# Patient Record
Sex: Male | Born: 1990 | Race: White | Hispanic: No | Marital: Single | Smoking: Current every day smoker
Health system: Southern US, Community
[De-identification: ages and names within clinical notes are randomized; demographics above are authoritative.]

## PROBLEM LIST (undated history)

## (undated) DIAGNOSIS — G43909 Migraine, unspecified, not intractable, without status migrainosus: Secondary | ICD-10-CM

## (undated) DIAGNOSIS — Z860100 Personal history of colon polyps, unspecified: Secondary | ICD-10-CM

## (undated) DIAGNOSIS — K5190 Ulcerative colitis, unspecified, without complications: Secondary | ICD-10-CM

## (undated) HISTORY — DX: Personal history of colonic polyps: Z86.010

## (undated) HISTORY — DX: Ulcerative colitis, unspecified, without complications: K51.90

## (undated) HISTORY — PX: HAND SURGERY: SHX662

## (undated) HISTORY — DX: Migraine, unspecified, not intractable, without status migrainosus: G43.909

## (undated) HISTORY — DX: Personal history of colon polyps, unspecified: Z86.0100

---
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
## 2010-03-03 HISTORY — PX: FRACTURE SURGERY: SHX138

## 2010-05-18 LAB — POCT HEMOGLOBIN-HEMACUE: Hemoglobin: 16.9 g/dL (ref 13.0–17.0)

## 2010-05-19 LAB — URINALYSIS, ROUTINE W REFLEX MICROSCOPIC
Bilirubin Urine: NEGATIVE
Glucose, UA: NEGATIVE mg/dL
Hgb urine dipstick: NEGATIVE
Ketones, ur: NEGATIVE mg/dL
Nitrite: NEGATIVE
Protein, ur: NEGATIVE mg/dL
Specific Gravity, Urine: 1.025 (ref 1.005–1.030)
Urobilinogen, UA: 0.2 mg/dL (ref 0.0–1.0)
pH: 6.5 (ref 5.0–8.0)

## 2010-05-19 LAB — HEMOCCULT GUIAC POC 1CARD (OFFICE): Fecal Occult Bld: POSITIVE

## 2010-05-19 LAB — COMPREHENSIVE METABOLIC PANEL WITH GFR
AST: 22 U/L (ref 0–37)
Alkaline Phosphatase: 104 U/L (ref 39–117)
GFR calc Af Amer: >60 mL/min (ref >60)
Glucose, Bld: 88 mg/dL (ref 70–99)
Total Bilirubin: 0.4 mg/dL (ref 0.3–1.2)
Total Protein: 7.6 g/dL (ref 6.0–8.3)

## 2010-05-19 LAB — COMPREHENSIVE METABOLIC PANEL
ALT: 22 U/L (ref 0–53)
Albumin: 4 g/dL (ref 3.5–5.2)
BUN: 17 mg/dL (ref 6–23)
CO2: 23 mEq/L (ref 19–32)
Calcium: 9.5 mg/dL (ref 8.4–10.5)
Chloride: 108 mEq/L (ref 96–112)
Creatinine, Ser: 0.7 mg/dL (ref 0.4–1.5)
GFR calc non Af Amer: >60 mL/min (ref >60)
Potassium: 3.6 mEq/L (ref 3.5–5.1)
Sodium: 145 mEq/L (ref 135–145)

## 2010-05-19 LAB — LIPASE, BLOOD: Lipase: 31 U/L (ref 23–300)

## 2010-05-19 LAB — CBC
HCT: 49 % (ref 39.0–52.0)
Hemoglobin: 16.5 g/dL (ref 13.0–17.0)
MCHC: 33.7 g/dL (ref 30.0–36.0)
MCV: 85.4 fL (ref 78.0–100.0)
Platelets: 392 K/uL (ref 150–400)
RBC: 5.74 MIL/uL (ref 4.22–5.81)
RDW: 14 % (ref 11.5–15.5)
WBC: 12.2 K/uL — ABNORMAL HIGH (ref 4.0–10.5)

## 2010-05-19 LAB — DIFFERENTIAL
Basophils Absolute: 0.2 10*3/uL — ABNORMAL HIGH (ref 0.0–0.1)
Basophils Relative: 2 % — ABNORMAL HIGH (ref 0–1)
Eosinophils Absolute: 0.3 10*3/uL (ref 0.0–0.7)
Eosinophils Relative: 2 % (ref 0–5)
Lymphocytes Relative: 21 % (ref 12–46)
Lymphs Abs: 2.5 10*3/uL (ref 0.7–4.0)
Monocytes Absolute: 1.2 K/uL — ABNORMAL HIGH (ref 0.1–1.0)
Monocytes Relative: 10 % (ref 3–12)
Neutro Abs: 8 10*3/uL — ABNORMAL HIGH (ref 1.7–7.7)
Neutrophils Relative %: 66 % (ref 43–77)

## 2010-07-15 NOTE — Op Note (Signed)
[name], [name]               ACCOUNT NO.:  [id number]   MEDICAL RECORD NO.:  [id number]          PATIENT TYPE:  AMB   LOCATION:  SDS                          FACILITY:  [facility]   PHYSICIAN:  Jon Gills, M.D.  DATE OF BIRTH:  06-Apr-1990   DATE OF PROCEDURE:  03/11/2007  DATE OF DISCHARGE:  03/11/2007                               OPERATIVE REPORT   PREOPERATIVE DIAGNOSIS:  Ulcerative colitis with lower gastrointestinal  bleeding.   POSTOPERATIVE DIAGNOSIS:  Ulcerative colitis with lower gastrointestinal  bleeding.   NAME OF OPERATION:  Colonoscopy with biopsy.   SURGEON:  Jon Gills, MD   ASSISTANT:  None.   DESCRIPTION OF FINDINGS:  Following informed written consent, the  patient was taken to the operating room and placed under general  anesthesia with continuous cardiopulmonary monitoring.  He remained in  the supine position and examination of perineum revealed no tags or  fissures.  Digital examination of the rectum revealed an empty rectal  vault.  The Pentax colonoscope was inserted per rectum and advanced  without difficulty to the terminal ileum.  Diffuse granularity was  present during the first 50 cm of colon with occasional serpiginous  ulceration.  The remainder of the colon was erythematous but less  granular.  The ascending colon was obscured by adherent stool, however.  I was able to cannulate the ileocecal valve and examination of terminal  ileum was grossly normal.  Multiple biopsies of the terminal ileum were  histologically normal, whereas biopsies throughout the colon revealed  active inflammation.  No granulomata were seen.  The endoscope was  gradually withdrawn and the patient was awakened and taken to the  recovery room in satisfactory condition.  He will be released later  today to the care of his family.   DESCRIPTION OF TECHNICAL PROCEDURES USED:  Pentax colonoscope with cold  biopsy forceps.   DESCRIPTION OF SPECIMENS REMOVED:   Terminal ileum x3 in formalin.  Ascending colon x3 in formalin.  Transverse colon x3 in formalin.  Descending colon x3 in formalin.  Sigmoid colon x3 in formalin.  Rectum  x3 in formalin.           ______________________________  Jon Gills, M.D.     JHC/MEDQ  D:  03/25/2007  T:  03/25/2007  Job:  [id number]   cc:   Tammy R. Collins Scotland, M.D.

## 2010-07-15 NOTE — Discharge Summary (Signed)
Johnathan Suarez, Johnathan Suarez               ACCOUNT NO.:  [id number]   MEDICAL RECORD NO.:  [id number]          PATIENT TYPE:  INP   LOCATION:  6120                         FACILITY:  [facility]   PHYSICIAN:  Dr. Benay Pike            DATE OF BIRTH:  1991/01/25   DATE OF ADMISSION:  01/18/2007  DATE OF DISCHARGE:  01/23/2007                               DISCHARGE SUMMARY   REASON FOR HOSPITALIZATION:  Ulcerative colitis exacerbation.   HOSPITAL COURSE:  A 20 year old male with a past medical history of  ulcerative colitis presents with severe pain and grossly bloody  diarrhea.  Initial H&H demonstrated a hemoglobin of 15.9 and hematocrit  of 47.1.  It did not change significantly during stay.  Stool studies,  amylase, lipase, and KUB were all within normal limits.  He started on  IV steroids and was placed on a Dilaudid PCA for pain.  Bloody diarrhea  had resolved, and PCA was slowly weaned.  The patient was tolerating  p.o. prednisone, azithromycin, Percocet, and famotidine well, so the PCA  was discontinued on January 21, 2007, and he was changed to oral pain  medications on January 22, 2007.  The patient continued to do well on  oral medications and tolerated a regular pediatric diet well.  He was  discharged home in good condition with resolution of his bloody stools  and control of his pain.   OPERATIONS AND PROCEDURES:  None.   FINAL DIAGNOSIS:  Ulcerative colitis exacerbation.   DISCHARGE MEDICATIONS AND INSTRUCTIONS:  The patient was advised to call  Dr. Chestine Spore, his pediatric GI subspecialist doctor, on Monday to schedule  a followup appointment.  He has previously scheduled an appointment on  February 03, 2007, and he should ask Dr. Chestine Spore if he needs to follow up  sooner.  He was also advised to contact Dr. Chestine Spore or his primary care  physician, Dr. Collins Scotland, or return to the emergency department should he  have return of abdominal pain and bloody stools as they are currently  resolved.   The patient was advised to start the continuous home  medications of azathioprine 100 mg once a day as well as his famotidine  20 mg b.i.d., and he was started on a prednisone taper of 30 mg b.i.d.  x7 days then to 25 mg b.i.d. x7 days then to 20 mg b.i.d. x7 days.  That  should take him through his appointment with Dr. Chestine Spore as scheduled on  February 03, 2007, and further wean should be advised by Dr. Chestine Spore.  Of  note, the patient was on 5 mg of prednisone p.o. once a day per the  patient upon arrival.  Oxycodone 5-10 mg p.o. q.4-6 p.r.n. for pain, 30  pills were dispensed.   PENDING RESULTS:  None.   FOLLOWUP:  Dr. Chestine Spore on February 03, 2007 or sooner if advised so by Dr.  Chestine Spore.   DISCHARGE CONDITION:  Stable.   Discharge summaries were not faxed to the primary care physician or to  his consultant, Dr. Chestine Spore, as neither fax number was able  to be obtained  by searching the web.  Dr. Collins Scotland clinic phone number is [phone],  and Dr. Ophelia Charter clinic is called [facility].  His clinic phone number is [phone].           ______________________________  Dr. Claudell Kyle  D:  01/23/2007  T:  01/23/2007  Job:  [id number]

## 2010-07-18 NOTE — Discharge Summary (Signed)
NAMEKEYLEN, ECKENRODE               ACCOUNT NO.:  [id number]   MEDICAL RECORD NO.:  [id number]          PATIENT TYPE:  INP   LOCATION:  6148                         FACILITY:  [facility]   PHYSICIAN:  Henrietta Hoover, MD    DATE OF BIRTH:  20-Sep-1990   DATE OF ADMISSION:  06/19/2005  DATE OF DISCHARGE:  06/24/2005                                 DISCHARGE SUMMARY   HOSPITAL COURSE:  A 20 year old male who was admitted for shoulder pain,  tachypnea, shortness of breath, and chest pain.  He had a chest x-ray which  showed left lower lobe pneumonia and left pleural effusion.  The patient has  a history of ulcerative colitis as well.  He was started on IV Zosyn and  azithromycin.  He was continued on his sulfasalazine at 1500 mg b.i.d. and  prednisone 20 mg daily.  On June 20, 2005, he was started on Solu-Medrol 12  mg b.i.d. instead of his prednisone.  He remained afebrile throughout the  entire hospital stay.  He required oxygen initially but was quickly weaned.  He had several episodes of bloody diarrhea secondary to his ulcerative  colitis, but he improved clinically on his aforementioned medication  regimen.   OPERATIONS/PROCEDURES:  Chest x-ray on June 20, 2005, left lower lobe  pneumonia and small moderate-sized left pleural effusion.   Chest x-ray on June 21, 2005, stable and unchanged.   DIAGNOSES:  1.  Left lower lobe pneumonia.  2.  Ulcerative colitis.   MEDICATIONS:  1.  Avelox 400 mg p.o. daily x6 days.  2.  Prednisone 30 mg p.o. daily.  3.  Sulfasalazine 1500 mg p.o. b.i.d.   DISCHARGE WEIGHT:  59.6 kg.   DISCHARGE CONDITION:  Improved.   DISCHARGE INSTRUCTIONS/FOLLOWUP:  1.  Patient is to follow up with Dr. Yehuda Budd, his primary care physician, on      June 26, 2005 at 2:30 p.m.  2.  He is to follow up with Dr. Chestine Spore of GI, for which he already has an      appointment in two weeks.  3.  He is to limit physical activity and advance slowly as tolerated.    ______________________________  Pediatrics Resident    ______________________________  Henrietta Hoover, MD   PR/MEDQ  D:  06/24/2005  T:  06/25/2005  Job:  [id number]

## 2010-07-18 NOTE — Op Note (Signed)
NAMEHORATIO, BERTZ               ACCOUNT NO.:  [id number]   MEDICAL RECORD NO.:  [id number]          PATIENT TYPE:  INP   LOCATION:  6122                         FACILITY:  [facility]   PHYSICIAN:  Jon Gills, M.D.  DATE OF BIRTH:  05-06-1990   DATE OF PROCEDURE:  09/23/2004  DATE OF DISCHARGE:  09/29/2004                                 OPERATIVE REPORT   PREOPERATIVE DIAGNOSIS:  Bloody diarrhea.   POSTOPERATIVE DIAGNOSIS:  Universal colitis, presumably ulcerative colitis.   OPERATION PERFORMED:  Colonoscopy with biopsy.   SURGEON:  Jon Gills, M.D.   ASSISTANT:  None.   DESCRIPTION OF PROCEDURE:  Following informed written consent, the patient  was taken to the operating room and placed under general anesthesia with  continuous cardiopulmonary monitoring.  He remained in the supine position  and examination of the perineum revealed no tags or fissures.  Digital  examination of the rectum revealed an empty rectal vault.  The Olympus PCF-  140 colonoscope was inserted per rectum and advanced without difficulty.  The entire colon was examined and found to have diffuse granularity,  erythema, edema and friability.  No skip lesions were seen.  No serpiginous  ulcers were seen.  The cecum was entered and the ileocecal valve lips were  swollen.  Unfortunately, I was unable to cannulate the terminal ileum.  Multiple biopsies were obtained while removing the colonoscope. Once the  colonoscope was withdrawn, Rasean was awakened and taken to the recovery  room in satisfactory condition.  He will be transferred to the pediatric  inpatient unit once he is awake for initiation of intravenous steroid  therapy for presumed inflammatory bowel disease.   DESCRIPTION OF TECHNICAL PROCEDURES USED:  Olympus PCF-140 colonoscope with  cold biopsy forceps.   DESCRIPTION OF SPECIMENS REMOVED:  Colon biopsies times 12 submitted in  Formalin to surgical pathology.       JHC/MEDQ  D:   10/01/2004  T:  10/02/2004  Job:  [id number]

## 2010-07-18 NOTE — Discharge Summary (Signed)
Johnathan Suarez, Johnathan Suarez               ACCOUNT NO.:  [id number]   MEDICAL RECORD NO.:  [id number]          PATIENT TYPE:  INP   LOCATION:  6122                         FACILITY:  [facility]   PHYSICIAN:  Orie Rout, M.D.DATE OF BIRTH:  01/06/1991   DATE OF ADMISSION:  09/22/2004  DATE OF DISCHARGE:  09/29/2004                                 DISCHARGE SUMMARY   HOSPITAL COURSE:  The patient is a 20 year old male who presented with  several months of weight loss and bloody diarrhea which was worsening on the  days prior to admission.  On admission, the patient was markedly anemic with  a hemoglobin 7.3.  The patient was transfused 2 units of packed red blood  cells and his hemoglobin remained stable throughout the rest of the hospital  course.  On hospital day two, the patient had a colonoscopy which revealed  severe pancolitis.  Biopsies later confirmed ulcerative colitis.  The  patient was started on IV steroids on July 25 and the patient's diet was  gradually advanced.  The patient's bowel movements gradually became more  solid, as well.  The patient was started on iron sulfate and Sulfasalazine  p.o. as well as oral prednisone prior to discharge.  At the time of  discharge, the patient was afebrile and was tolerating p.o.   OPERATIONS AND PROCEDURES:  1.  September 22, 2004, the patient received 2 units of packed red blood cells.  2.  September 23, 2004, colonoscopy revealed severe pancolitis as well as      biopsies which showed inflammatory colitis, likely ulcerative colitis.      No dysplasia or malignancy.   LABORATORY DATA:  Admission CBC showed white blood cell count 8.5,  hemoglobin 7.3, hematocrit 24.4, platelets 698.  CBC on July 29 showed white  blood cell count 12, hemoglobin 9.7, hematocrit 31.1, platelets 812.  Stool  cultures negative.  O&P negative.  EHEC toxin negative.  C. diff negative.   DIAGNOSIS:  Ulcerative colitis.   MEDICATIONS:  Sulfasalazine 500 mg p.o. b.i.d.,  ferrous sulfate 325 mg p.o.  b.i.d., prednisone 30 mg p.o. b.i.d., Pepcid 20 mg p.o. b.i.d.   DISCHARGE WEIGHT:  66.5 kilograms.   CONDITION ON DISCHARGE:  Good/improved.   DISCHARGE INSTRUCTIONS:  1.  Dr. Chestine Spore, pediatric GI on Wednesday, October 01, 2004, at 10:20 a.m.  2.  Dr. Caryl Never at [facility] on October 07, 2004, at 2      p.m.  The patient was advised to return to the hospital or seek medical      attention if he is having any symptoms of light headedness, dizziness,      or any other concerns.       MR/MEDQ  D:  09/29/2004  T:  09/29/2004  Job:  [id number]

## 2010-08-06 IMAGING — CR DG HAND COMPLETE 3+V*R*
3 series · 3 of 3 positions shown · non-contrast
Comparison: None

CLINICAL DATA: Right hand injury and deformity; pain and swelling
at the third and fourth metacarpal.

RIGHT HAND - COMPLETE 3+ VIEW

[x hand pa right]
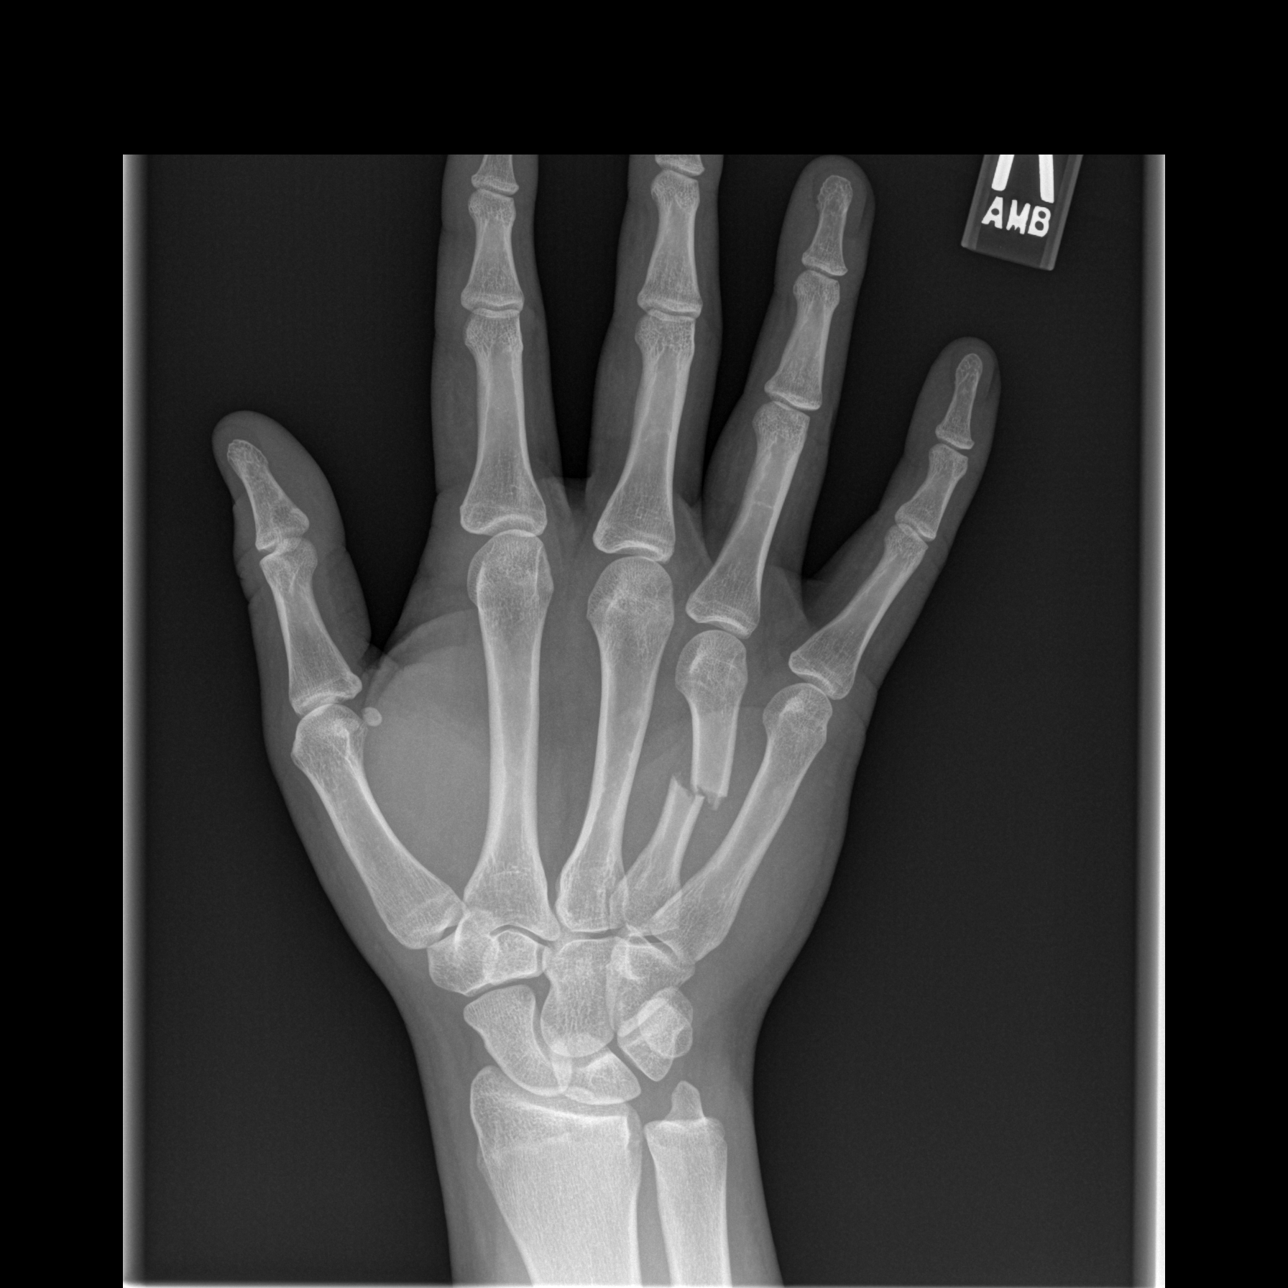

[x hand oblique right]
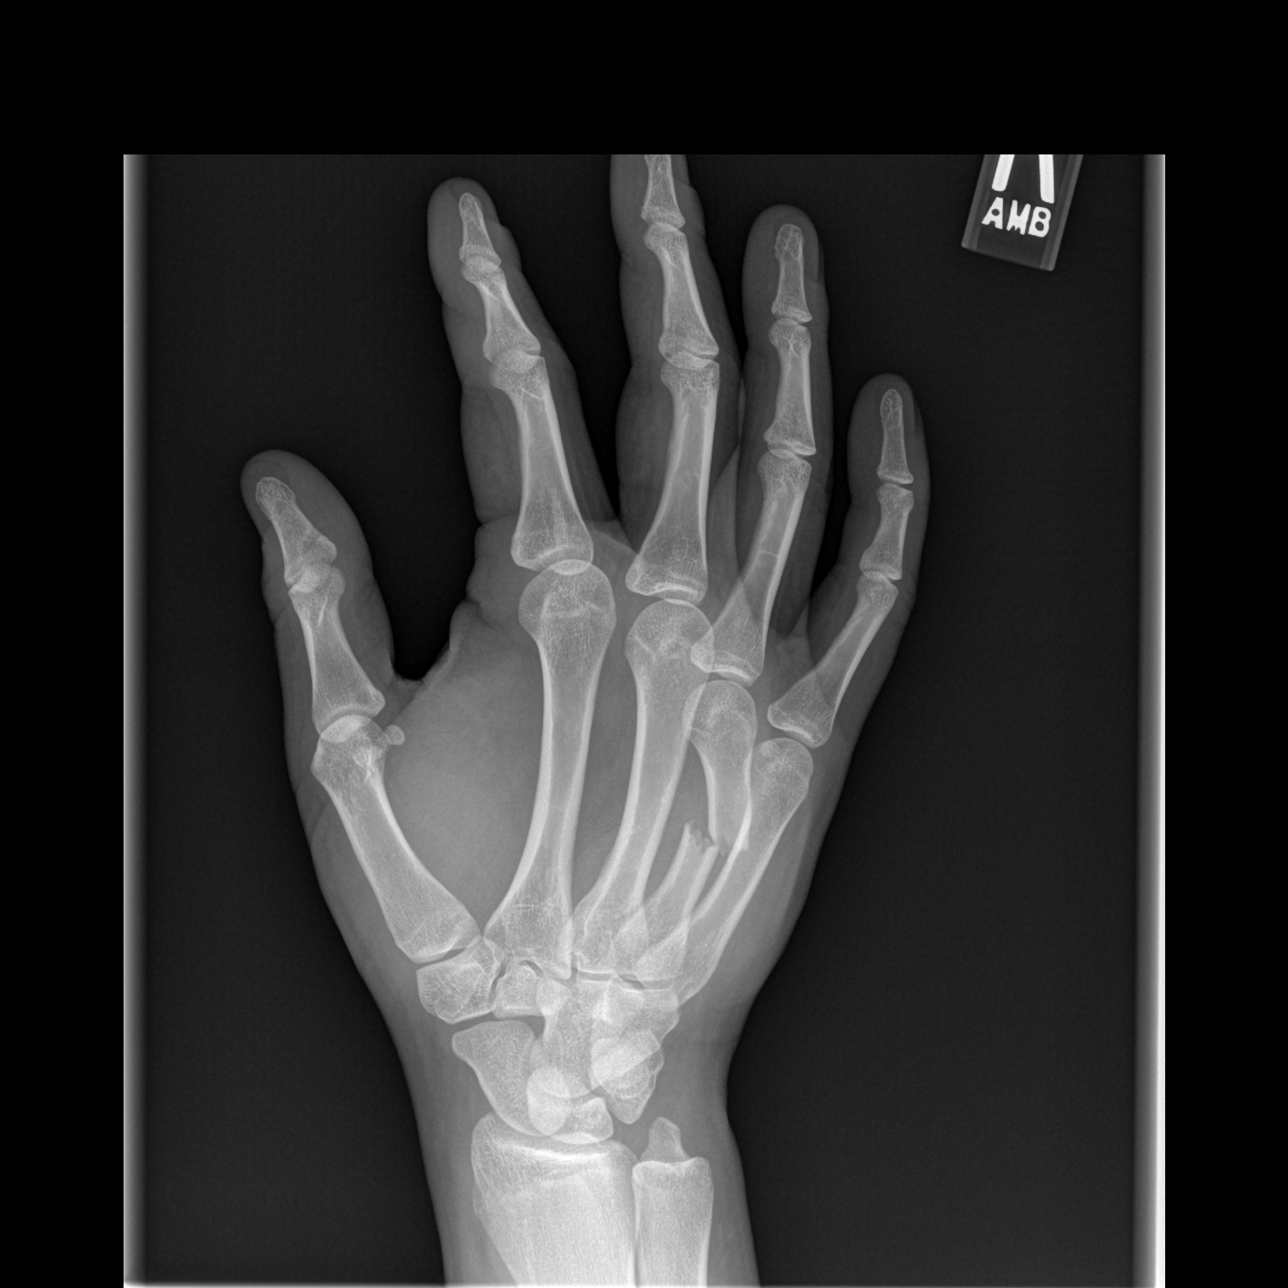

[x hand lat right]
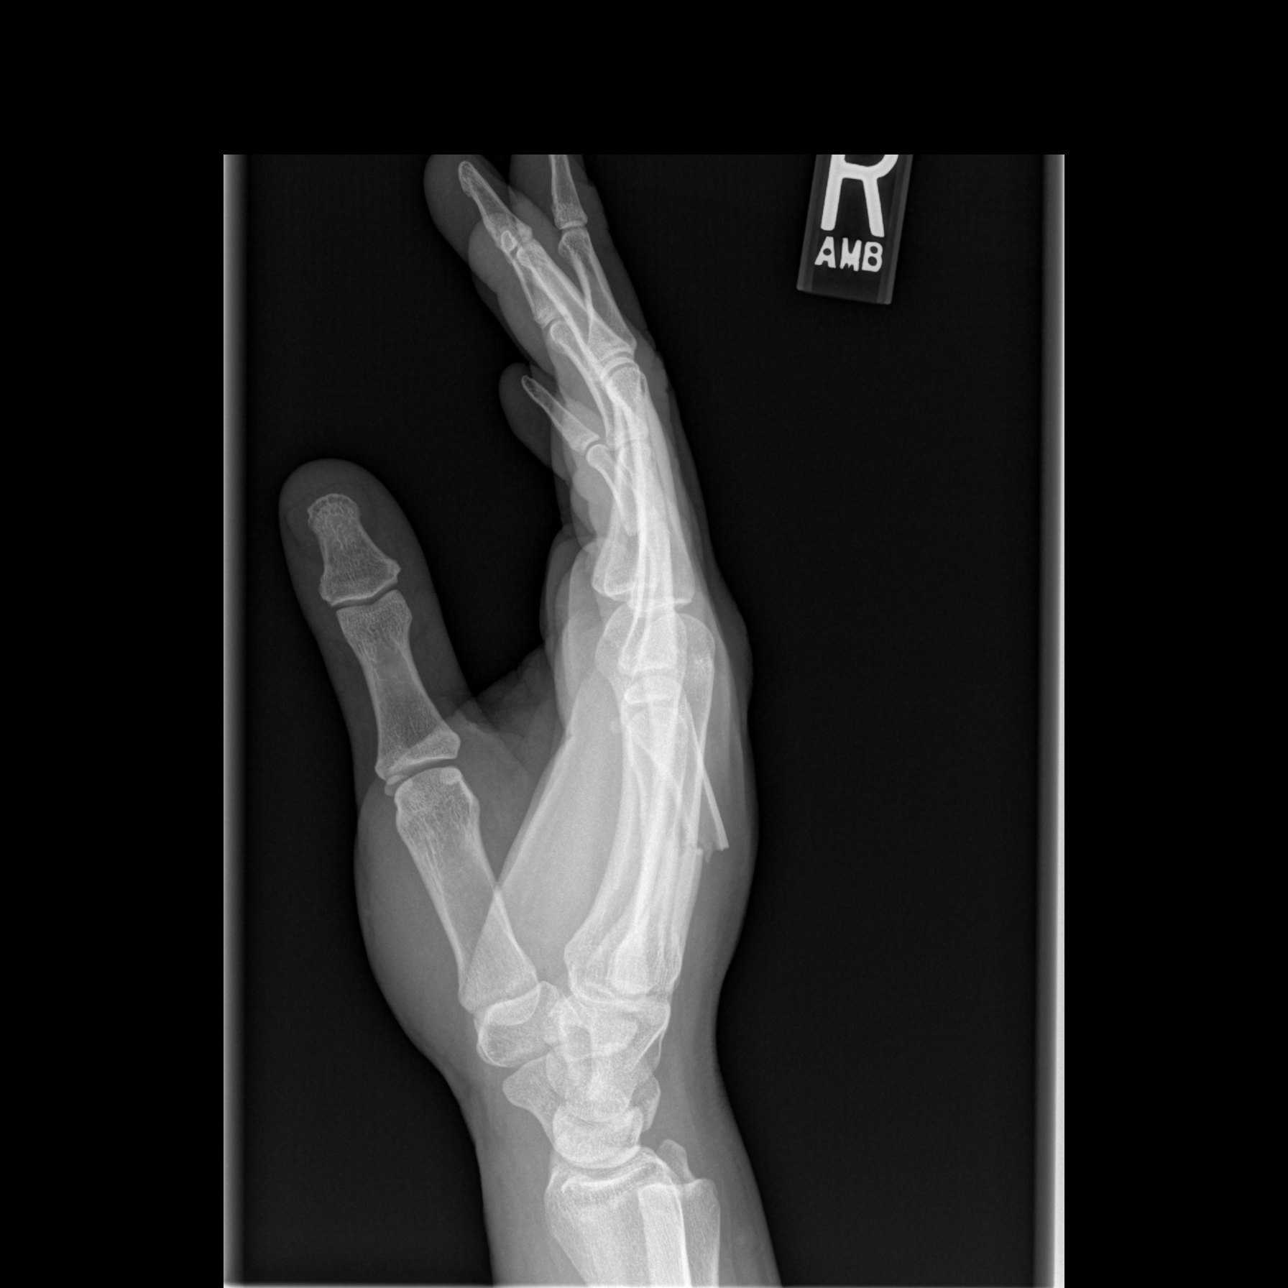

[3 of 3 positions shown; findings below may reference images not displayed]

FINDINGS: There is a displaced horizontal fracture through the
midshaft of the fourth metacarpal, demonstrating approximately one
shaft width dorsoulnar displacement of the distal fragment.  No
additional fractures are identified.  The joint spaces are
preserved.  The carpal rows appear intact.

Overlying soft tissue swelling is noted.
IMPRESSION: Displaced horizontal fracture through the midshaft of the fourth
metacarpal, demonstrating approximately one shaft width dorsoulnar
displacement of the distal fragment.

## 2010-09-04 ENCOUNTER — Inpatient Hospital Stay (INDEPENDENT_AMBULATORY_CARE_PROVIDER_SITE_OTHER)
Admission: RE | Admit: 2010-09-04 | Discharge: 2010-09-04 | Disposition: A | Discharge status: Home / Self Care | Payer: Self-pay | Source: Ambulatory Visit | Attending: Family Medicine | Admitting: Family Medicine

## 2010-09-04 ENCOUNTER — Ambulatory Visit (INDEPENDENT_AMBULATORY_CARE_PROVIDER_SITE_OTHER): Payer: Self-pay

## 2010-09-04 DIAGNOSIS — S93409A Sprain of unspecified ligament of unspecified ankle, initial encounter: Secondary | ICD-10-CM

## 2010-09-18 ENCOUNTER — Encounter: Payer: Self-pay | Admitting: Family Medicine

## 2010-09-18 ENCOUNTER — Ambulatory Visit (INDEPENDENT_AMBULATORY_CARE_PROVIDER_SITE_OTHER): Payer: Self-pay | Admitting: Family Medicine

## 2010-09-18 VITALS — BP 130/79 | HR 58 | Temp 97.7°F | Ht 72.0 in | Wt 212.6 lb

## 2010-09-18 DIAGNOSIS — M25572 Pain in left ankle and joints of left foot: Secondary | ICD-10-CM | POA: Insufficient documentation

## 2010-09-18 DIAGNOSIS — M25579 Pain in unspecified ankle and joints of unspecified foot: Secondary | ICD-10-CM

## 2010-09-18 NOTE — Patient Instructions (Signed)
You have an ankle sprain. Ice the area for 15 minutes at a time, 3-4 times a day Take aleve 1-2 tabs twice a day with food for 1 week then as needed. Elevate above the level of your heart when possible Crutches if needed to help with walking Wear ankle brace when up and walking around for 4 more weeks.  THEN only wear when playing sports or when on uneven ground for the next 6 weeks. Come out of the boot/brace twice a day to do Up/down and alphabet exercises 2-3 sets of each. Start theraband strengthening exercises and balance exercises - do these most days of the week for the next 4 weeks. Follow up with me as needed.

## 2010-09-18 NOTE — Assessment & Plan Note (Signed)
Left ankle sprain - improving - should be completely healed over next 1-2 weeks.  Demonstrated theraband exercises and balance exercises to do regularly over next 4 weeks.  ASO when ambulatory for next 4 weeks then only with sports/irregular ground for the following 6 weeks.  Icing, nsaids, elevation as needed.  Return to work on Monday without restrictions.  See instructions for further. F/u prn.

## 2010-09-18 NOTE — Progress Notes (Signed)
  Subjective:    Patient ID: Johnathan Suarez, male    DOB: [birth year], 20 y.o.   MRN: [id number]  PCP: Dr. Yehuda Budd  HPI 20 yo M here for left ankle injury.  Patient reports he was playing basketball when he suffered inversion injury on 09/03/10. Was able to walk 4 steps immediately following this but pain and swelling were worse the next day so went to Urgent Care. X-rays of foot and ankle were negative. Required crutches for at least 4 days. Was given an ASO which he has been wearing. Icing, elevating. Not taking any meds but doing simple ROM exercises. Pain mostly improved though mild pain lateral ankle and still feels unstable. No remote injuries to this ankle.  Past Medical History  Diagnosis Date  . Ulcerative colitis     No current outpatient prescriptions on file prior to visit.    Past Surgical History  Procedure Date  . Hand surgery     No Known Allergies  History   Social History  . Marital Status: Single    Spouse Name: N/A    Number of Children: N/A  . Years of Education: N/A   Occupational History  . Not on file.   Social History Main Topics  . Smoking status: Current Everyday Smoker -- 1.0 packs/day    Types: Cigarettes  . Smokeless tobacco: Not on file  . Alcohol Use: Not on file  . Drug Use: Not on file  . Sexually Active: Not on file   Other Topics Concern  . Not on file   Social History Narrative  . No narrative on file    Family History  Problem Relation Age of Onset  . Hyperlipidemia Mother   . Hyperlipidemia Maternal Grandmother   . Diabetes Neg Hx   . Heart attack Neg Hx   . Hypertension Neg Hx   . Sudden death Neg Hx     BP 130/79  Pulse 58  Temp(Src) 97.7 F (36.5 C) (Oral)  Ht 6' (1.829 m)  Wt 212 lb 9.6 oz (96.435 kg)  BMI 28.83 kg/m2  Review of Systems See HPI above.    Objective:   Physical Exam Gen: NAD L ankle: No gross deformity, swelling, ecchymoses FROM with 4+/5 strength with dorsiflexion and ext  rotation (pain at ATFL with these). TTP at ATFL.  No TTP malleoli, navicular, base 5th, fibular head. 1+ talar tilt and ant drawer. Negative syndesmotic compression. Thompsons test negative. NV intact distally.     Assessment & Plan:  1. Left ankle sprain - improving - should be completely healed over next 1-2 weeks.  Demonstrated theraband exercises and balance exercises to do regularly over next 4 weeks.  ASO when ambulatory for next 4 weeks then only with sports/irregular ground for the following 6 weeks.  Icing, nsaids, elevation as needed.  Return to work on Monday without restrictions.  See instructions for further. F/u prn.

## 2010-11-20 LAB — CBC
HCT: 46
Hemoglobin: 15.1
MCV: 82.8
RBC: 5.56
RDW: 18.5 — ABNORMAL HIGH
WBC: 11.2

## 2010-12-09 LAB — COMPREHENSIVE METABOLIC PANEL
ALT: 14
AST: 17
Alkaline Phosphatase: 80
CO2: 29
Calcium: 8.9
Chloride: 98
Potassium: 4.4
Sodium: 133 — ABNORMAL LOW
Total Bilirubin: 0.7

## 2010-12-09 LAB — OVA AND PARASITE EXAMINATION

## 2010-12-09 LAB — STOOL CULTURE

## 2010-12-09 LAB — CBC
HCT: 46.6
Hemoglobin: 15.3
Hemoglobin: 15.6
Hemoglobin: 15.9
MCHC: 32.9
MCHC: 33.8
MCV: 80
MCV: 81.7
MCV: 82
Platelets: 480 — ABNORMAL HIGH
RBC: 5.7
RBC: 5.79 — ABNORMAL HIGH
RBC: 5.89 — ABNORMAL HIGH
RDW: 15.9 — ABNORMAL HIGH
WBC: 15.9 — ABNORMAL HIGH

## 2010-12-09 LAB — DIFFERENTIAL
Basophils Absolute: 0.1
Eosinophils Relative: 1
Lymphocytes Relative: 7 — ABNORMAL LOW
Monocytes Absolute: 1
Neutro Abs: 16.8 — ABNORMAL HIGH
Neutrophils Relative %: 86 — ABNORMAL HIGH

## 2010-12-09 LAB — I-STAT 8, (EC8 V) (CONVERTED LAB)
Chloride: 102
Glucose, Bld: 104 — ABNORMAL HIGH
TCO2: 31
pH, Ven: 7.365 — ABNORMAL HIGH

## 2010-12-09 LAB — POCT I-STAT CREATININE: Operator id: 196461

## 2011-12-08 ENCOUNTER — Encounter: Payer: Self-pay | Admitting: Family

## 2011-12-08 ENCOUNTER — Ambulatory Visit (INDEPENDENT_AMBULATORY_CARE_PROVIDER_SITE_OTHER): Payer: BC Managed Care – PPO | Admitting: Family

## 2011-12-08 VITALS — BP 108/74 | HR 80 | Temp 98.2°F | Resp 18 | Ht 72.0 in | Wt 209.0 lb

## 2011-12-08 DIAGNOSIS — R079 Chest pain, unspecified: Secondary | ICD-10-CM

## 2011-12-08 DIAGNOSIS — K5190 Ulcerative colitis, unspecified, without complications: Secondary | ICD-10-CM

## 2011-12-08 DIAGNOSIS — F341 Dysthymic disorder: Secondary | ICD-10-CM

## 2011-12-08 DIAGNOSIS — F418 Other specified anxiety disorders: Secondary | ICD-10-CM

## 2011-12-08 MED ORDER — SERTRALINE HCL 50 MG PO TABS: 50.0000 mg | ORAL_TABLET | Freq: Every day | ORAL | Status: DC

## 2011-12-08 NOTE — Patient Instructions (Addendum)
Sertraline- start 1/2 tablet once daily for 1 week and then increase to a full tablet once daily on week two as tolerated.   Please follow up in 1 month. Welcome to [facility]!

## 2011-12-08 NOTE — Assessment & Plan Note (Signed)
This is clinically stable. He follows at [facility] as needed.

## 2011-12-08 NOTE — Assessment & Plan Note (Signed)
The pt tells me that he is scheduled to meed with a therapist today and I have encouraged him to do this.  I also think that he will benefit from the addition of an SSRI.  Will initiate sertraline.  I instructed pt to start 1/2 tablet once daily for 1 week and then increase to a full tablet once daily on week two as tolerated.  We discussed common side effects such as nausea, drowsiness and weight gain.  Also discussed rare but serious side effect of suicide ideation.  She is instructed to discontinue medication go directly to ED if this occurs.  Pt verbalizes understanding.  Plan follow up in 1 month to evaluate progress.   30 minutes spent with the pt today.  >50% of this time was spent counseling pt on his anxiety and depression.

## 2011-12-08 NOTE — Progress Notes (Signed)
Subjective:    Patient ID: Johnathan Suarez, male    DOB: Mar 20, 1990, 21 y.o.   MRN: [id number]  HPI  "Claudette Suarez" is a 21 yr old male who presents today to establish care.  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 He also lives with Mom. Reports approximately a 6 month hx of difficulty motivating. Reports hopelessness.  Has to force himself to get up in the mornings.  Sometimes has SOB and Chest pain in the morning which goes away after he "gets going."  He reports associated panic attacks- had 2 yesterday. Reports that he becomes easily frustrated.  Feels that he is always letting people down and that the things he does "never work out right." Often feels alone. Denies suicidal ideation or homicidal ideation. Denies personal or family hx of depression that he is aware of. Reports that his escape is "sports." Enjoys watching all sports.   Hx of blood in stool- 2011 (Hx of ulcerative colitis)-  He was intolerant to side effects of meds he took.  Occasional marijuana use for abdominal pain.  Every few weeks.  He has seen Dr. Adalberto Ill ([facility]). Plans to follow there.  Reports one episode of Peptic ulcers (2008)- reports that this resolved. Was told that he has colon polyps- removed on colo (non-cancerous per pt).  Migraines-  Did not seek medical help.  Happened in High school- he self treated with ibuprofen- no recent migraines.     Review of Systems  Constitutional: Negative for unexpected weight change.  HENT: Positive for hearing loss. Negative for congestion.        Reports hearing problems in crowds- declines hearing test  Eyes: Negative for visual disturbance.  Respiratory: Negative for cough and shortness of breath.   Cardiovascular:       Reports some chest pain and shortness of breath when he wakes up in the morning.    Genitourinary: Negative for dysuria and frequency.  Musculoskeletal:  Negative for myalgias.       L shoulder pain (injury in HS) both knees (since 12th grade) bothered him since he took prednisone.  R hand pain since surgery from fracture  Skin: Negative for rash.  Neurological: Negative for headaches.  Hematological: Negative for adenopathy.  Psychiatric/Behavioral:       See HPI   Past Medical History  Diagnosis Date  . Ulcerative colitis   . Ulcerative colitis   . Migraine   . Personal history of colonic polyps     History   Social History  . Marital Status: Single    Spouse Name: N/A    Number of Children: 0  . Years of Education: N/A   Occupational History  .     Social History Main Topics  . Smoking status: Current Every Day Smoker -- 0.5 packs/day    Types: Cigarettes  . Smokeless tobacco: Never Used  . Alcohol Use: 6.0 oz/week    10 Cans of beer per week  . Drug Use: Yes  . Sexually Active: Not on file   Other Topics Concern  . Not on file   Social History Narrative   Regular exercise:  6 x weeklyNo caffeine use    Past Surgical History  Procedure Date  . Hand surgery   . Colonoscopy 2011  . Fracture surgery 03/03/10    steel rod placed right hand    Family History  Problem Relation Age of  Onset  . Hyperlipidemia Mother   . Deep vein thrombosis Mother   . Hyperlipidemia Maternal Grandmother   . Diabetes Maternal Grandmother   . Hypertension Neg Hx   . Sudden death Neg Hx   . Heart attack Maternal Uncle   . Arthritis Paternal Grandmother   . Arthritis Paternal Grandfather   . Cancer Other     prostate    No Known Allergies  Current Outpatient Prescriptions on File Prior to Visit  Medication Sig Dispense Refill  . sertraline (ZOLOFT) 50 MG tablet Take 1 tablet (50 mg total) by mouth daily.  30 tablet  0    BP 108/74  Pulse 80  Temp 98.2 F (36.8 C) (Oral)  Resp 18  Ht 6' (1.829 m)  Wt 209 lb 0.6 oz (94.82 kg)  BMI 28.35 kg/m2  SpO2 98%       Objective:   Physical Exam  Constitutional: He  appears well-developed and well-nourished. No distress.  HENT:  Head: Normocephalic and atraumatic.       Bilateral cerumen impaction is noted.   Cardiovascular: Normal rate and regular rhythm.   No murmur heard. Pulmonary/Chest: Effort normal and breath sounds normal. No respiratory distress. He has no wheezes. He has no rales. He exhibits no tenderness.  Abdominal: Soft.       Mild left sided tenderness without guarding.    Psychiatric:       Flat affect, but pleasant and appropriate.           Assessment & Plan:

## 2012-01-08 ENCOUNTER — Ambulatory Visit: Payer: BC Managed Care – PPO | Admitting: Family

## 2012-01-08 DIAGNOSIS — Z0289 Encounter for other administrative examinations: Secondary | ICD-10-CM

## 2016-10-12 ENCOUNTER — Ambulatory Visit: Payer: BC Managed Care – PPO | Admitting: Physician Assistant

## 2016-10-19 ENCOUNTER — Ambulatory Visit: Payer: BC Managed Care – PPO | Admitting: Physician Assistant

## 2016-10-26 ENCOUNTER — Ambulatory Visit: Payer: BC Managed Care – PPO | Admitting: Physician Assistant

## 2016-10-26 DIAGNOSIS — Z0189 Encounter for other specified special examinations: Secondary | ICD-10-CM

## 2017-05-06 ENCOUNTER — Ambulatory Visit: Payer: BC Managed Care – PPO | Admitting: Family Medicine

## 2020-04-08 ENCOUNTER — Ambulatory Visit (INDEPENDENT_AMBULATORY_CARE_PROVIDER_SITE_OTHER): Payer: BC Managed Care – PPO | Admitting: Physician Assistant

## 2020-04-08 DIAGNOSIS — Z5329 Procedure and treatment not carried out because of patient's decision for other reasons: Secondary | ICD-10-CM

## 2020-04-08 NOTE — Progress Notes (Signed)
No show

## 2022-02-13 ENCOUNTER — Ambulatory Visit: Payer: BC Managed Care – PPO | Admitting: Physician Assistant

## 2022-03-09 ENCOUNTER — Ambulatory Visit: Payer: BC Managed Care – PPO | Admitting: Physician Assistant

## 2022-06-23 ENCOUNTER — Ambulatory Visit
Admission: EM | Admit: 2022-06-23 | Discharge: 2022-06-23 | Disposition: A | Discharge status: Home / Self Care | Payer: BC Managed Care – PPO | Attending: Family Medicine | Admitting: Family Medicine

## 2022-06-23 ENCOUNTER — Encounter: Payer: Self-pay | Admitting: Emergency Medicine

## 2022-06-23 DIAGNOSIS — G44209 Tension-type headache, unspecified, not intractable: Secondary | ICD-10-CM

## 2022-06-23 DIAGNOSIS — M542 Cervicalgia: Secondary | ICD-10-CM

## 2022-06-23 DIAGNOSIS — G43819 Other migraine, intractable, without status migrainosus: Secondary | ICD-10-CM

## 2022-06-23 MED ORDER — IBUPROFEN 800 MG PO TABS: 800.0000 mg | ORAL_TABLET | Freq: Three times a day (TID) | ORAL | 0 refills | Status: AC

## 2022-06-23 MED ORDER — CYCLOBENZAPRINE HCL 10 MG PO TABS: 10.0000 mg | ORAL_TABLET | Freq: Two times a day (BID) | ORAL | 0 refills | Status: AC | PRN

## 2022-06-23 MED ORDER — KETOROLAC TROMETHAMINE 30 MG/ML IJ SOLN
60.0000 mg | Freq: Once | INTRAMUSCULAR | Status: AC
  Administered 2022-06-23: 60 mg via INTRAMUSCULAR

## 2022-06-23 MED ORDER — METOCLOPRAMIDE HCL 5 MG/ML IJ SOLN
5.0000 mg | Freq: Once | INTRAMUSCULAR | Status: AC
  Administered 2022-06-23: 5 mg via INTRAMUSCULAR

## 2022-06-23 MED ORDER — DEXAMETHASONE SODIUM PHOSPHATE 10 MG/ML IJ SOLN
10.0000 mg | Freq: Once | INTRAMUSCULAR | Status: AC
  Administered 2022-06-23: 10 mg via INTRAMUSCULAR

## 2022-06-23 NOTE — ED Provider Notes (Signed)
[facility]    CSN: [id number] Arrival date & time: 06/23/22  0931      History   Chief Complaint Chief Complaint  Patient presents with   Headache    HPI Johnathan Suarez is a 32 y.o. male.   HPI  Patient does have a history of migraines, but has not had one for over 10 years He states that he went to visit his mother in the hospital after she had surgery.  Shortly thereafter developed some stiffness in his neck that he thought was stress related, or perhaps he slept wrong.  Over the next couple of days he had some fever chills and bodyaches.  These went away.  The headache has persisted and worsened.  He has some photophobia.  He still has neck stiffness and pain.  He has had some insomnia, mostly due to pain.  Vision is intact.  Thinking is normal.  Normal strength and use of arms and legs, no change in balance.  No trauma.  No persistent infection symptoms or fever/chills  Past Medical History:  Diagnosis Date   Migraine    Personal history of colonic polyps    Ulcerative colitis    Ulcerative colitis     Patient Active Problem List   Diagnosis Date Noted   Depression with anxiety 12/08/2011   Ulcerative colitis 12/08/2011   Left ankle pain 09/18/2010    Past Surgical History:  Procedure Laterality Date   COLONOSCOPY  2011   FRACTURE SURGERY  03/03/10   steel rod placed right hand   HAND SURGERY         Home Medications    Prior to Admission medications   Medication Sig Start Date End Date Taking? Authorizing Provider  cyclobenzaprine (FLEXERIL) 10 MG tablet Take 1 tablet (10 mg total) by mouth 2 (two) times daily as needed for muscle spasms. 06/23/22  Yes Eustace Moore, MD  ibuprofen (ADVIL) 800 MG tablet Take 1 tablet (800 mg total) by mouth 3 (three) times daily. 06/23/22  Yes Eustace Moore, MD    Family History Family History  Problem Relation Age of Onset   Hyperlipidemia Mother    Deep vein thrombosis Mother    Hyperlipidemia  Maternal Grandmother    Diabetes Maternal Grandmother    Arthritis Paternal Grandmother    Arthritis Paternal Grandfather    Heart attack Maternal Uncle    Cancer Other        prostate   Hypertension Neg Hx    Sudden death Neg Hx     Social History Social History   Tobacco Use   Smoking status: Every Day    Packs/day: 0.50    Years: 12.00    Additional pack years: 0.00    Total pack years: 6.00    Types: Cigarettes   Smokeless tobacco: Never  Vaping Use   Vaping Use: Never used  Substance Use Topics   Alcohol use: Not Currently   Drug use: Yes    Frequency: 14.0 times per week    Types: Marijuana     Allergies   Patient has no known allergies.   Review of Systems Review of Systems See HPI  Physical Exam Triage Vital Signs ED Triage Vitals  Enc Vitals Group     BP 06/23/22 0954 125/81     Pulse Rate 06/23/22 0954 84     Resp 06/23/22 0954 18     Temp 06/23/22 0954 98.3 F (36.8 C)     Temp  Source 06/23/22 0954 Oral     SpO2 06/23/22 0954 97 %     Weight 06/23/22 0956 240 lb (108.9 kg)     Height 06/23/22 0956 6' (1.829 m)     Head Circumference --      Peak Flow --      Pain Score 06/23/22 0956 10     Pain Loc --      Pain Edu? --      Excl. in GC? --    No data found.  Updated Vital Signs BP 125/81 (BP Location: Left Arm)   Pulse 84   Temp 98.3 F (36.8 C) (Oral)   Resp 18   Ht 6' (1.829 m)   Wt 108.9 kg   SpO2 97%   BMI 32.55 kg/m        Physical Exam Constitutional:      General: He is in acute distress.     Appearance: He is well-developed and normal weight. He is ill-appearing.  HENT:     Head: Normocephalic and atraumatic.  Eyes:     General: No visual field deficit.    Conjunctiva/sclera: Conjunctivae normal.     Pupils: Pupils are equal, round, and reactive to light.  Neck:     Comments: Slow but full range of motion of neck.  Tenderness on the right side of the paraspinous muscles where the insertion to the  occiput Cardiovascular:     Rate and Rhythm: Normal rate.  Pulmonary:     Effort: Pulmonary effort is normal. No respiratory distress.  Abdominal:     General: There is no distension.     Palpations: Abdomen is soft.  Musculoskeletal:        General: Normal range of motion.  Skin:    General: Skin is warm and dry.  Neurological:     Mental Status: He is alert. Mental status is at baseline.     Cranial Nerves: No cranial nerve deficit, dysarthria or facial asymmetry.     Sensory: No sensory deficit.     Motor: No weakness.     Coordination: Coordination normal.     Gait: Gait normal.     Deep Tendon Reflexes: Reflexes normal.  Psychiatric:        Mood and Affect: Mood normal.        Behavior: Behavior normal.      UC Treatments / Results  Labs (all labs ordered are listed, but only abnormal results are displayed) Labs Reviewed - No data to display  EKG   Radiology No results found.  Procedures Procedures (including critical care time)  Medications Ordered in UC Medications  ketorolac (TORADOL) 30 MG/ML injection 60 mg (60 mg Intramuscular Given 06/23/22 1058)  metoCLOPramide (REGLAN) injection 5 mg (5 mg Intramuscular Given 06/23/22 1057)  dexamethasone (DECADRON) injection 10 mg (10 mg Intramuscular Given 06/23/22 1057)    Initial Impression / Assessment and Plan / UC Course  I have reviewed the triage vital signs and the nursing notes.  Pertinent labs & imaging results that were available during my care of the patient were reviewed by me and considered in my medical decision making (see chart for details).     Patient had a good response to injections.  Photophobia disappeared.  Could move his neck fully.  Headache improved. Discussed home care. Discussed referral to PCP. Final Clinical Impressions(s) / UC Diagnoses   Final diagnoses:  Neck pain on right side  Other migraine without status migrainosus, intractable  Muscle tension  headache     Discharge  Instructions      Home today to rest May take ibuprofen 3 times a day with food as needed for headache or neck pain Take the Flexeril at bedtime.  May take 2 times a day when not at work (may cause drowsiness) Use ice or heat to painful neck muscles Commend establishing with primary care doctor     ED Prescriptions     Medication Sig Dispense Auth. Provider   ibuprofen (ADVIL) 800 MG tablet Take 1 tablet (800 mg total) by mouth 3 (three) times daily. 21 tablet Eustace Moore, MD   cyclobenzaprine (FLEXERIL) 10 MG tablet Take 1 tablet (10 mg total) by mouth 2 (two) times daily as needed for muscle spasms. 20 tablet Eustace Moore, MD      PDMP not reviewed this encounter.   Eustace Moore, MD 06/23/22 1145

## 2022-06-23 NOTE — Discharge Instructions (Signed)
Home today to rest May take ibuprofen 3 times a day with food as needed for headache or neck pain Take the Flexeril at bedtime.  May take 2 times a day when not at work (may cause drowsiness) Use ice or heat to painful neck muscles Commend establishing with primary care doctor

## 2022-06-23 NOTE — ED Triage Notes (Signed)
Headache x 1 week  Chills 1 week ago  Pain started in his neck and radiated up to his head Pulsating HA at back of head Light sensitivity  Insomnia x 1 week  OTC dual tylenol/ibuprofen at 0720 Took meds x 6 daily - no relief

## 2022-07-07 ENCOUNTER — Ambulatory Visit (INDEPENDENT_AMBULATORY_CARE_PROVIDER_SITE_OTHER): Payer: 59 | Admitting: Family Medicine

## 2022-07-07 ENCOUNTER — Encounter: Payer: Self-pay | Admitting: Family Medicine

## 2022-07-07 VITALS — BP 109/72 | HR 105 | Temp 99.1°F | Ht 72.0 in | Wt 230.7 lb

## 2022-07-07 DIAGNOSIS — Z7689 Persons encountering health services in other specified circumstances: Secondary | ICD-10-CM

## 2022-07-07 DIAGNOSIS — G4700 Insomnia, unspecified: Secondary | ICD-10-CM | POA: Diagnosis not present

## 2022-07-07 DIAGNOSIS — G43719 Chronic migraine without aura, intractable, without status migrainosus: Secondary | ICD-10-CM

## 2022-07-07 DIAGNOSIS — L723 Sebaceous cyst: Secondary | ICD-10-CM

## 2022-07-07 MED ORDER — PROPRANOLOL HCL 40 MG PO TABS: 40.0000 mg | ORAL_TABLET | Freq: Two times a day (BID) | ORAL | 0 refills | Status: DC

## 2022-07-07 MED ORDER — TRAZODONE HCL 50 MG PO TABS: 50.0000 mg | ORAL_TABLET | Freq: Every evening | ORAL | 1 refills | Status: DC | PRN

## 2022-07-07 NOTE — Progress Notes (Signed)
New Patient Office Visit  Subjective    Patient ID: Johnathan Suarez, male    DOB: 11/03/1990  Age: 32 y.o. MRN: [id number]  CC:  Chief Complaint  Patient presents with   Establish Care   Migraine    With neck pain.    Insomnia    4-5 hours daily.     Referral    Dermatology-He has a place on his right cheek that has increased in size. He states that it has been there for a long time. He has pain until he drains it.     HPI Johnathan Suarez presents to establish care with this practice. He is new to me.   Migraine: Went to urgent care on 06/23/22 for migraine. He was treated with toradol, Reglan, and decadron. He was neurological intact at urgent care. History of migraine as child, had not had headache in a while until he got a viral illness several weeks ago. He reports headache has improved since last week, but he is waking up in middle of night with throbbing pain, takes ibuprofen which helps. Has ongoing digestive issues and cannot keep taking NSAIDS, interested in different medicaiton to manage his migraines.   Systemic symptoms such as fever or chills: none  History of cancer: no Neurological changes: no Sudden onset: yes Worse headache ever: last week headaches were bad, this week has improved > 8 years of age: no Change in usual headache: no Postural change in headache: no, feels pulsating feeling in head at times,  Headache caused after sneezing, coughing, or exercise: no Painful eye: no Visual changes: no Post-trauma: no Unilateral: back of head Nausea or vomiting: weeks ago, none recently  Photophobia: yes when has headache Phonophobia: yes when has headache  Insomnia: ongoing for one year. Sleeps 4-5 hours per day. Wakes up in sweat. Wakes up and cannot get back to sleep. Interested in something to help with sleep.   Derm referral: has a nodule on right side of cheek for several years. Has pus coming out occasionally. Interested in referral. Reports when it  drains it will go away temporaily but comes back daily. Wants referral today.   Outpatient Encounter Medications as of 07/07/2022  Medication Sig   cyclobenzaprine (FLEXERIL) 10 MG tablet Take 1 tablet (10 mg total) by mouth 2 (two) times daily as needed for muscle spasms.   ibuprofen (ADVIL) 800 MG tablet Take 1 tablet (800 mg total) by mouth 3 (three) times daily.   propranolol (INDERAL) 40 MG tablet Take 1 tablet (40 mg total) by mouth 2 (two) times daily.   traZODone (DESYREL) 50 MG tablet Take 1 tablet (50 mg total) by mouth at bedtime as needed for sleep.   No facility-administered encounter medications on file as of 07/07/2022.    Past Medical History:  Diagnosis Date   Migraine    Personal history of colonic polyps    Ulcerative colitis    Ulcerative colitis (HCC)     Past Surgical History:  Procedure Laterality Date   COLONOSCOPY  2011   FRACTURE SURGERY  03/03/10   steel rod placed right hand   HAND SURGERY      Family History  Problem Relation Age of Onset   Hyperlipidemia Mother    Deep vein thrombosis Mother    Hyperlipidemia Maternal Grandmother    Diabetes Maternal Grandmother    Arthritis Paternal Grandmother    Arthritis Paternal Grandfather    Heart attack Maternal Uncle    Cancer Other  prostate   Hypertension Neg Hx    Sudden death Neg Hx     Social History   Socioeconomic History   Marital status: Single    Spouse name: Not on file   Number of children: 0   Years of education: Not on file   Highest education level: Not on file  Occupational History    Employer: [employer]  Tobacco Use   Smoking status: Every Day    Packs/day: 0.50    Years: 12.00    Additional pack years: 0.00    Total pack years: 6.00    Types: Cigarettes   Smokeless tobacco: Never  Vaping Use   Vaping Use: Never used  Substance and Sexual Activity   Alcohol use: Not Currently   Drug use: Yes    Frequency: 14.0 times per week    Types: Marijuana    Sexual activity: Not Currently  Other Topics Concern   Not on file  Social History Narrative   Regular exercise:  6 x weekly   No caffeine use   Social Determinants of Health   Financial Resource Strain: Not on file  Food Insecurity: Not on file  Transportation Needs: Not on file  Physical Activity: Not on file  Stress: Not on file  Social Connections: Not on file  Intimate Partner Violence: Not on file    Review of Systems  Eyes:  Negative for blurred vision and double vision.  Respiratory:  Positive for shortness of breath (smoker).   Cardiovascular:  Negative for chest pain.  Gastrointestinal:  Negative for nausea and vomiting.  Neurological:  Positive for headaches. Negative for dizziness.        Objective    BP 109/72   Pulse (!) 105   Temp 99.1 F (37.3 C) (Oral)   Ht 6' (1.829 m)   Wt 230 lb 11.2 oz (104.6 kg)   SpO2 96%   BMI 31.29 kg/m   Physical Exam Vitals and nursing note reviewed.  Constitutional:      General: He is not in acute distress.    Appearance: Normal appearance.  Cardiovascular:     Rate and Rhythm: Regular rhythm.     Heart sounds: Normal heart sounds.  Pulmonary:     Effort: Pulmonary effort is normal.     Breath sounds: Normal breath sounds.  Skin:    General: Skin is warm and dry.     Capillary Refill: Capillary refill takes less than 2 seconds.     Comments: Approx 1 cm erythematous circular cyst on right cheek that drained this morning. No pus noted at office.   Neurological:     General: No focal deficit present.     Mental Status: He is alert. Mental status is at baseline.     GCS: GCS eye subscore is 4. GCS verbal subscore is 5. GCS motor subscore is 6.     Cranial Nerves: Cranial nerves 2-12 are intact.     Motor: Motor function is intact.     Coordination: Coordination is intact.     Gait: Gait is intact.     Comments: Alert, communicative with normal speech. PERRLA, vision grossly intact. EOM normal, no  nystagmus. Smile symmetric. Uvula midline, swallowing intact, tongue midline and able to move side to side. Clinches jaw, puffs cheeks, closes eyes tightly, raises eyebrows. Hearing grossly intact. Moves head side to side against resistance. Shrugs shoulders against resistance.  5/5 upper and lower extremity strength. Ambulates with coordinated gait. Finger to  nose normal. Rapid alternating hands normal. Heel to shin normal. Romberg negative.    Psychiatric:        Mood and Affect: Mood normal.        Behavior: Behavior normal.        Thought Content: Thought content normal.        Judgment: Judgment normal.       Assessment & Plan:   Problem List Items Addressed This Visit     Establishing care with new doctor, encounter for - Primary   Intractable chronic migraine without aura and without status migrainosus  Neurologically intact in office today. No red flags. He is interested in Trazodone for sleep, therefore, he cannot start on sumatriptan for abortive therapy for migraine. Will start propranolol 40 mg BID daily to prevent migraine. Keep a headache journal, avoid trigger foods. Follow-up in one month to assess effectiveness in medication. Recommend heat pad for right sided neck pain.    Relevant Medications   propranolol (INDERAL) 40 MG tablet   traZODone (DESYREL) 50 MG tablet   Insomnia  Unable to get back to sleep after waking up. Encouraged good sleep hygiene, avoid electronics close to bed, avoid caffeine, and exercise too close to bed. Will try trazodone 50 mg 45-60 minutes before desired sleep. Follow-up in one month to assess effectiveness.    Relevant Medications   traZODone (DESYREL) 50 MG tablet   Sebaceous cyst  Recurring cyst on right side of face, spontaneously drains and then returns. Derm referral for definitive treatment.    Relevant Orders   Ambulatory referral to Dermatology    Return in about 4 weeks (around 08/04/2022) for medication mangagement .    Novella Olive, FNP

## 2022-08-04 ENCOUNTER — Ambulatory Visit: Payer: 59 | Admitting: Family Medicine

## 2022-08-12 ENCOUNTER — Encounter: Payer: Self-pay | Admitting: Family Medicine

## 2022-08-12 ENCOUNTER — Ambulatory Visit (INDEPENDENT_AMBULATORY_CARE_PROVIDER_SITE_OTHER): Payer: 59 | Admitting: Family Medicine

## 2022-08-12 VITALS — BP 104/61 | HR 70 | Temp 97.9°F | Resp 18 | Ht 72.0 in | Wt 240.7 lb

## 2022-08-12 DIAGNOSIS — G4700 Insomnia, unspecified: Secondary | ICD-10-CM

## 2022-08-12 DIAGNOSIS — G43719 Chronic migraine without aura, intractable, without status migrainosus: Secondary | ICD-10-CM

## 2022-08-12 MED ORDER — TRAZODONE HCL 50 MG PO TABS: 50.0000 mg | ORAL_TABLET | Freq: Every evening | ORAL | 1 refills | Status: AC | PRN

## 2022-08-12 MED ORDER — PROPRANOLOL HCL 40 MG PO TABS: 40.0000 mg | ORAL_TABLET | Freq: Two times a day (BID) | ORAL | 1 refills | Status: AC

## 2022-08-12 NOTE — Progress Notes (Signed)
Established Patient Office Visit  Subjective   Patient ID: Johnathan Suarez, male    DOB: 10/20/1990  Age: 32 y.o. MRN: [id number]  Chief Complaint  Patient presents with   Follow-up    Patient is here for a medication follow up he states that he doesn't have any other concerns at this time    HPI  Follow-up for medications for migraine and insomnia started on 07/07/22  5/7: migraine:  started on propanolol 40 mg BID. Reports no headaches since starting on medication. No side effects reported,  wants to continue. Feels well and happy with results.   5/7: insomnia: trazodone 50 mg as needed for sleep, reports sleeping much better, not awakening through the night. No side effects reported. Wants to continue     Review of Systems  Constitutional:  Negative for chills, fever and malaise/fatigue.  Eyes:  Negative for blurred vision and double vision.  Respiratory:  Negative for shortness of breath.   Cardiovascular:  Negative for chest pain.  Gastrointestinal:  Negative for abdominal pain, nausea and vomiting.  Musculoskeletal:  Negative for back pain and neck pain (stiffness).  Neurological:  Negative for dizziness and headaches.  Psychiatric/Behavioral:  Negative for depression and suicidal ideas.       Objective:     BP 104/61   Pulse 70   Temp 97.9 F (36.6 C) (Oral)   Resp 18   Ht 6' (1.829 m)   Wt 240 lb 11.2 oz (109.2 kg)   SpO2 97%   BMI 32.64 kg/m  BP Readings from Last 3 Encounters:  08/12/22 104/61  07/07/22 109/72  06/23/22 125/81      Physical Exam Vitals and nursing note reviewed.  Constitutional:      General: He is not in acute distress.    Appearance: Normal appearance.  Cardiovascular:     Rate and Rhythm: Regular rhythm.     Heart sounds: Normal heart sounds.  Pulmonary:     Effort: Pulmonary effort is normal.     Breath sounds: Normal breath sounds.  Skin:    General: Skin is warm and dry.     Capillary Refill: Capillary refill takes  less than 2 seconds.  Neurological:     General: No focal deficit present.     Mental Status: He is alert. Mental status is at baseline.  Psychiatric:        Mood and Affect: Mood normal.        Behavior: Behavior normal.        Thought Content: Thought content normal.        Judgment: Judgment normal.     No results found for any visits on 08/12/22.    The ASCVD Risk score (Arnett DK, et al., 2019) failed to calculate for the following reasons:   The 2019 ASCVD risk score is only valid for ages 29 to 60    Assessment & Plan:   Problem List Items Addressed This Visit     Intractable chronic migraine without aura and without status migrainosus - Primary  Excellent results from propranolol. No headaches reported since starting medication. Continue as prescribed.    Relevant Medications   propranolol (INDERAL) 40 MG tablet      Insomnia  Continue as prescribed.    Relevant Medications   traZODone (DESYREL) 50 MG tablet  Tolerating medications well. No headaches since going on propranolol BID. Sleeping through night since starting trazodone 50 mg.  Follow-up in 6 months, sooner if anything changes.  Refills sent.    Return in about 4 weeks (around 09/09/2022) for  CPE with labs .    Novella Olive, FNP

## 2022-09-09 ENCOUNTER — Encounter: Payer: 59 | Admitting: Family Medicine

## 2023-03-15 ENCOUNTER — Telehealth: Payer: Self-pay

## 2023-03-15 NOTE — Telephone Encounter (Signed)
 Potential appointment for my son (Newest Message First)March 12, 2023 Melissa Leigh Lesli Massa to [facility] (supporting Lupita FORBES Commander, MD)      03/12/23  5:03 PM [facility] Tatiana Daril Christians) phone is [phone] . please have your staff call him to schedule an appointment with Dr Suzann.  It was meant to be!!!! Eleanor Massa Lesli Massa to [facility] (supporting Lupita FORBES Commander, MD)      03/12/23 12:47 PM That's amazing! She worked with Dr. Stacy and she took over Ladd's case when Dr. Stacy finally retired! It will be so good to see her again!!! Commander Lupita FORBES, MD to Eleanor Massa Lesli Massa     03/12/23 12:00 PM We just added Dr. Inocente Suzann, an inflammatory bowel disease expert and she came from [facility].   I recommend he call and schedule an appointment with her.   Best regards,   Lupita CHARLENA Commander, MD, [facility]  Last read by Eleanor Massa Lesli Massa at  5:02 PM on 03/12/2023. March 11, 2023      03/11/23  4:37 PM Chnupa, Nyla LABOR, RN routed this conversation to Commander Lupita FORBES, MD Eleanor Massa Lesli Massa to [facility] (supporting Lupita FORBES Commander, MD)      03/11/23  4:36 PM Hi Dr. Commander!  Damien Music, one of your nurses, is one of my dear friends!!!!  I am with her in [facility] - She came to visit me from [facility]!!!  It truly is a small world!   My son, Johnathan Suarez, is a patient at the new [facility] primary care at [facility] in [place].  He has been a patient at [facility] With now retired Dr. Stacy, who was head of Pediatric gastroenterology. He has a very bad case of ulcerative colitis, Almost died several times, However, [facility] was able to stabilize him and begin him on Infusion cocktails.  This kept transpiring up until he would go in the anaphylactic shock.  He is now 33 years old.He is still in tremendous amount of pain and episodes and it is not under control.  What would be the next steps for him to  come see you?

## 2023-03-15 NOTE — Telephone Encounter (Signed)
 Left message for pt to call back

## 2023-03-17 NOTE — Telephone Encounter (Signed)
 Left message for pt to call back

## 2023-03-18 NOTE — Telephone Encounter (Signed)
Left message for patient to call back  

## 2023-03-19 NOTE — Telephone Encounter (Signed)
Mychart message sent to patient.

## 2023-03-22 NOTE — Telephone Encounter (Signed)
Spoke with pt mom Johnathan Suarez. Pt was scheduled to see Dr. Doy Hutching 03/26/2023 at 1:30 PM.  Johnathan Suarez made aware. Johnathan Suarez verbalized understanding with all questions answered.

## 2023-03-25 NOTE — Progress Notes (Deleted)
[facility] Initial Consultation   Referring Provider Novella Olive, FNP [address] [place],  [place] [address]  Primary Care Provider Novella Olive, FNP  Patient Profile: Johnathan Suarez is a 33 y.o. male who is seen in consultation in the [facility] at the request of Dr. Clementeen Hoof for evaluation and management of the problem(s) noted below.  Problem List: @DIAGSHORT @   History of Present Illness   Johnathan Suarez is a 33 y.o. male with a history of ***.   Last colonoscopy: *** Last endoscopy: ***  Last Abd CT/CTE/MRE: ***  GI Review of Symptoms Significant for {GIROS:50592}. Otherwise negative.  General Review of Systems  Review of systems is significant for the pertinent positives and negatives as listed per the HPI.  Full ROS is otherwise negative.  Past Medical History   Past Medical History:  Diagnosis Date   Migraine    Personal history of colonic polyps    Ulcerative colitis    Ulcerative colitis (HCC)      Past Surgical History   Past Surgical History:  Procedure Laterality Date   COLONOSCOPY  2011   FRACTURE SURGERY  03/03/10   steel rod placed right hand   HAND SURGERY       Allergies and Medications   Allergies  Allergen Reactions   Xanax [Alprazolam] Hives and Itching    @MEDSTODAY @  Family History   Family History  Problem Relation Age of Onset   Hyperlipidemia Mother    Deep vein thrombosis Mother    Hyperlipidemia Maternal Grandmother    Diabetes Maternal Grandmother    Arthritis Paternal Grandmother    Arthritis Paternal Grandfather    Heart attack Maternal Uncle    Cancer Other        prostate   Hypertension Neg Hx    Sudden death Neg Hx      Social History   Social History   Tobacco Use   Smoking status: Every Day    Current packs/day: 0.50    Average packs/day: 0.5 packs/day for 12.0 years (6.0 ttl pk-yrs)    Types: Cigarettes   Smokeless tobacco: Never  Vaping Use   Vaping status:  Never Used  Substance Use Topics   Alcohol use: Not Currently   Drug use: Yes    Frequency: 14.0 times per week    Types: Marijuana   Vidur reports that he has been smoking cigarettes. He has a 6 pack-year smoking history. He has never used smokeless tobacco. He reports that he does not currently use alcohol. He reports current drug use. Frequency: 14.00 times per week. Drug: Marijuana.  Vital Signs and Physical Examination  There were no vitals filed for this visit. There is no height or weight on file to calculate BMI.    General: Well developed, well nourished, no acute distress Head: Normocephalic and atraumatic Eyes: Sclerae anicteric, EOMI Ears: Normal auditory acuity Mouth: No deformities or lesions noted Lungs: Clear throughout to auscultation Heart: Regular rate and rhythm; No murmurs, rubs or bruits Abdomen: Soft, non tender and non distended. No masses, hepatosplenomegaly or hernias noted. Normal Bowel sounds Rectal: Musculoskeletal: Symmetrical with no gross deformities  Pulses:  Normal pulses noted Extremities: No edema or deformities noted Neurological: Alert oriented x 4, grossly nonfocal Psychological:  Alert and cooperative. Normal mood and affect  Review of Data  The following data was reviewed at the time of this encounter:  Laboratory Studies      Latest Ref Rng & Units 08/13/2009  12:44 AM 03/06/2009    8:11 AM 03/09/2007    8:53 AM  CBC  WBC 4.0 - 10.5 K/uL 12.2   11.2   Hemoglobin 13.0 - 17.0 g/dL 09.8  11.9  14.7   Hematocrit 39.0 - 52.0 % 49.0   46.0   Platelets 150 - 400 K/uL 392   430     Lab Results  Component Value Date   LIPASE 31 08/13/2009      Latest Ref Rng & Units 08/13/2009   12:44 AM 01/20/2007    1:15 PM 01/18/2007    8:19 PM  CMP  Glucose 70 - 99 mg/dL 88  829  562   BUN 6 - 23 mg/dL 17  5  14    Creatinine 0.4 - 1.5 mg/dL 0.7  1.30  1.0   Sodium 135 - 145 mEq/L 145  133  137   Potassium 3.5 - 5.1 mEq/L 3.6  4.4  4.4    Chloride 96 - 112 mEq/L 108  98  102   CO2 19 - 32 mEq/L 23  29    Calcium 8.4 - 10.5 mg/dL 9.5  8.9    Total Protein 6.0 - 8.3 g/dL 7.6  6.6    Total Bilirubin 0.3 - 1.2 mg/dL 0.4  0.7    Alkaline Phos 39 - 117 U/L 104  80    AST 0 - 37 U/L 22  17    ALT 0 - 53 U/L 22  14       Imaging Studies    GI Procedures and Studies      Clinical Impression  It is my clinical impression that Johnathan Suarez is a 33 y.o. male with;  ***  Plan  *** *** *** *** ***  Planned Follow Up No follow-ups on file.  The patient or caregiver verbalized understanding of the material covered, with no barriers to understanding. All questions were answered. Patient or caregiver is agreeable with the plan outlined above.    It was a pleasure to see Johnathan Suarez.  If you have any questions or concerns regarding this evaluation, do not hesitate to contact me.  Maren Beach, MD [facility]

## 2023-03-26 ENCOUNTER — Ambulatory Visit: Payer: Self-pay | Admitting: Pediatrics

## 2023-03-26 DIAGNOSIS — K5190 Ulcerative colitis, unspecified, without complications: Secondary | ICD-10-CM
